# Patient Record
Sex: Female | Born: 2000
Health system: Southern US, Community
[De-identification: ages and names within clinical notes are randomized; demographics above are authoritative.]

## PROBLEM LIST (undated history)

## (undated) HISTORY — PX: WISDOM TOOTH EXTRACTION: SHX21

## (undated) HISTORY — PX: MOUTH SURGERY: SHX715

---
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
## 2019-07-17 DIAGNOSIS — Z1159 Encounter for screening for other viral diseases: Secondary | ICD-10-CM | POA: Diagnosis not present

## 2019-07-17 DIAGNOSIS — Z20828 Contact with and (suspected) exposure to other viral communicable diseases: Secondary | ICD-10-CM | POA: Diagnosis not present

## 2019-11-11 ENCOUNTER — Ambulatory Visit: Payer: BC Managed Care – PPO | Admitting: Obstetrics & Gynecology

## 2019-11-11 ENCOUNTER — Other Ambulatory Visit: Payer: Self-pay

## 2019-11-11 ENCOUNTER — Encounter: Payer: Self-pay | Admitting: Obstetrics & Gynecology

## 2019-11-11 VITALS — BP 114/70 | HR 84 | Ht 68.0 in | Wt 178.0 lb

## 2019-11-11 DIAGNOSIS — N914 Secondary oligomenorrhea: Secondary | ICD-10-CM

## 2019-11-11 DIAGNOSIS — Z113 Encounter for screening for infections with a predominantly sexual mode of transmission: Secondary | ICD-10-CM

## 2019-11-11 DIAGNOSIS — Z30430 Encounter for insertion of intrauterine contraceptive device: Secondary | ICD-10-CM

## 2019-11-11 DIAGNOSIS — Z3202 Encounter for pregnancy test, result negative: Secondary | ICD-10-CM | POA: Diagnosis not present

## 2019-11-11 DIAGNOSIS — Z3009 Encounter for other general counseling and advice on contraception: Secondary | ICD-10-CM

## 2019-11-11 DIAGNOSIS — Z01419 Encounter for gynecological examination (general) (routine) without abnormal findings: Secondary | ICD-10-CM

## 2019-11-11 LAB — POCT URINE PREGNANCY: Preg Test, Ur: NEGATIVE

## 2019-11-11 MED ORDER — LEVONORGESTREL 13.5 MG IU IUD
INTRAUTERINE_SYSTEM | Freq: Once | INTRAUTERINE | Status: AC
Start: 2019-11-11 — End: 2019-11-11
  Administered 2019-11-11: 15:00:00 via INTRAUTERINE

## 2019-11-11 NOTE — Progress Notes (Signed)
   Subjective:    Patient ID: Ana Lucero, female    DOB: 2001-05-27, 18 y.o.   MRN: [id number]  HPI  Pt presents wanting IUD.  Pt is not currently sexually active. She has not had sex in psat 2 weeks.  We reviewed all types of birth control and risks benefits.  She would like the IUD.  Pt does not have regular menses so this would be good way to protect her endometrium.    Review of Systems  Constitutional: Negative.   Respiratory: Negative.   Cardiovascular: Negative.   Genitourinary: Negative.   Psychiatric/Behavioral: Negative.        Objective:   Physical Exam Vitals reviewed.  Constitutional:      General: She is not in acute distress.    Appearance: She is well-developed.  HENT:     Head: Normocephalic and atraumatic.  Eyes:     Conjunctiva/sclera: Conjunctivae normal.  Cardiovascular:     Rate and Rhythm: Normal rate.  Pulmonary:     Effort: Pulmonary effort is normal.  Skin:    General: Skin is warm and dry.  Neurological:     Mental Status: She is alert and oriented to person, place, and time.    Vitals:   11/11/19 1429  BP: 114/70  Pulse: 84  Weight: 178 lb (80.7 kg)  Height: 5\' 8"  (1.727 m)     Assessment & Plan:  18 yo female wanting IUD UPT negative Cultures today Contraception counseling  IUD Procedure Note Patient identified, informed consent performed.  Discussed risks of irregular bleeding, cramping, infection, malpositioning or misplacement of the IUD outside the uterus which may require further procedures. Time out was performed.  Urine pregnancy test negative.  Speculum placed in the vagina.  Cervix visualized.  Cleaned with Betadine x 2.  Grasped anteriorly with a single tooth tenaculum.  Uterus sounded to 7 cm.  Ana Lucero IUD placed per manufacturer's recommendations.  Strings trimmed to 3 cm. Tenaculum was removed, good hemostasis noted.  Patient tolerated procedure well.   Patient was given post-procedure instructions and the [facility] care card  with expiration date.  Patient was also asked to check IUD strings periodically and follow up in 4-6 weeks for IUD check.

## 2019-11-12 DIAGNOSIS — N915 Oligomenorrhea, unspecified: Secondary | ICD-10-CM | POA: Insufficient documentation

## 2019-11-12 LAB — CERVICOVAGINAL ANCILLARY ONLY
Chlamydia: NEGATIVE
Comment: NEGATIVE
Comment: NORMAL
Neisseria Gonorrhea: NEGATIVE

## 2019-12-03 ENCOUNTER — Ambulatory Visit: Payer: BC Managed Care – PPO | Admitting: Certified Nurse Midwife

## 2019-12-06 ENCOUNTER — Ambulatory Visit: Payer: BC Managed Care – PPO | Admitting: Certified Nurse Midwife

## 2019-12-06 DIAGNOSIS — Z09 Encounter for follow-up examination after completed treatment for conditions other than malignant neoplasm: Secondary | ICD-10-CM

## 2020-06-09 ENCOUNTER — Telehealth: Payer: Self-pay

## 2020-06-09 NOTE — Telephone Encounter (Signed)
Dallas's dad called and states she is really down and stating at times she would be better off dead. I advised Chrissie Noa to take her to the ED for evaluation if she is a harm to herself or others. He didn't feel like she would harm herself or others. He states she will be home with the whole family. She has been scheduled for tomorrow to see Dr Linford Arnold.

## 2020-06-10 ENCOUNTER — Ambulatory Visit (INDEPENDENT_AMBULATORY_CARE_PROVIDER_SITE_OTHER): Payer: BC Managed Care – PPO | Admitting: Family Medicine

## 2020-06-10 ENCOUNTER — Encounter: Payer: Self-pay | Admitting: Family Medicine

## 2020-06-10 DIAGNOSIS — F411 Generalized anxiety disorder: Secondary | ICD-10-CM | POA: Diagnosis not present

## 2020-06-10 MED ORDER — SERTRALINE HCL 50 MG PO TABS: ORAL_TABLET | ORAL | 1 refills | Status: DC

## 2020-06-10 NOTE — Progress Notes (Addendum)
Established Patient Office Visit  Subjective:  Patient ID: Ana Lucero, female    DOB: 06-12-2001  Age: 19 y.o. MRN: [id number]  CC:  Chief Complaint  Patient presents with  . mood    HPI Ana Lucero presents to discuss depression.  Initially seen he said she really did not want to be here that her parents had requested that she come.  They were concerned because of her anxiety and depression.  She reports that she remembers feeling anxious from the time she was about 13.  She is currently in college studying criminal justice and also working full-time at [employer] to help be able to afford to go to college.  She feels like if she had more money that she did not have to work and could focus on school the lot of her stress and depressive symptoms would actually improve.  But it is been very difficult to do both.  She is not interested in any therapy or counseling.  Sounds like she started to do some therapy at some point and did not have a good experience.  She is never been on prescription medication for anxiety or depression.  No past medical history on file.  Past Surgical History:  Procedure Laterality Date  . MOUTH SURGERY    . WISDOM TOOTH EXTRACTION      Family History  Problem Relation Age of Onset  . Breast cancer Paternal Grandmother   . Ovarian cancer Paternal Grandmother   . Breast cancer Maternal Grandmother   . Leukemia Maternal Grandfather     Social History   Socioeconomic History  . Marital status: Single    Spouse name: Not on file  . Number of children: Not on file  . Years of education: Not on file  . Highest education level: Not on file  Occupational History  . Not on file  Tobacco Use  . Smoking status: Never Smoker  . Smokeless tobacco: Never Used  Substance and Sexual Activity  . Alcohol use: No  . Drug use: Never  . Sexual activity: Not Currently    Birth control/protection: None  Other Topics Concern  . Not on file  Social History  Narrative  . Not on file   Social Determinants of Health   Financial Resource Strain:   . Difficulty of Paying Living Expenses:   Food Insecurity:   . Worried About Charity fundraiser in the Last Year:   . Arboriculturist in the Last Year:   Transportation Needs:   . Film/video editor (Medical):   Marland Kitchen Lack of Transportation (Non-Medical):   Physical Activity:   . Days of Exercise per Week:   . Minutes of Exercise per Session:   Stress:   . Feeling of Stress :   Social Connections:   . Frequency of Communication with Friends and Family:   . Frequency of Social Gatherings with Friends and Family:   . Attends Religious Services:   . Active Member of Clubs or Organizations:   . Attends Archivist Meetings:   Marland Kitchen Marital Status:   Intimate Partner Violence:   . Fear of Current or Ex-Partner:   . Emotionally Abused:   Marland Kitchen Physically Abused:   . Sexually Abused:     No outpatient medications prior to visit.   No facility-administered medications prior to visit.    No Known Allergies  ROS Review of Systems    Objective:    Physical Exam Constitutional:  Appearance: She is well-developed.  HENT:     Head: Normocephalic and atraumatic.  Cardiovascular:     Rate and Rhythm: Normal rate and regular rhythm.     Heart sounds: Normal heart sounds.  Pulmonary:     Effort: Pulmonary effort is normal.     Breath sounds: Normal breath sounds.  Skin:    General: Skin is warm and dry.  Neurological:     Mental Status: She is alert and oriented to person, place, and time.  Psychiatric:        Behavior: Behavior normal.     BP 110/61   Pulse 78   Ht _0  (1.727 m)   Wt 181 lb (82.1 kg)   SpO2 98%   BMI 27.52 kg/m  Wt Readings from Last 3 Encounters:  06/10/20 181 lb (82.1 kg) (95 %, Z= 1.61)*  11/11/19 178 lb (80.7 kg) (94 %, Z= 1.57)*  09/12/17 153 lb (69.4 kg) (87 %, Z= 1.15)*   * Growth percentiles are based on CDC (Girls, 2-20 Years) data.      Health Maintenance Due  Topic Date Due  . Hepatitis C Screening  Never done  . HIV Screening  Never done    There are no preventive care reminders to display for this patient.  No results found for: TSH No results found for: WBC, HGB, HCT, MCV, PLT No results found for: NA, K, CHLORIDE, CO2, GLUCOSE, BUN, CREATININE, BILITOT, ALKPHOS, AST, ALT, PROT, ALBUMIN, CALCIUM, ANIONGAP, EGFR, GFR No results found for: CHOL No results found for: HDL No results found for: LDLCALC No results found for: TRIG No results found for: CHOLHDL No results found for: HGBA1C    Assessment & Plan:   Problem List Items Addressed This Visit      Other   GAD (generalized anxiety disorder)    We discussed diagnosis today of generalized anxiety disorder. PHQ 9 score of 8 and GAD-7 score of 11.  She does have some depression but I do feel like it is a little bit more situational where as I feel that the anxiety disorder is more ongoing and long-term.  We discussed the nature of anxiety and typical treatment strategies which usually include therapy/counseling to work on behavioral therapy in addition to regular exercise which she does.  She says she goes to the gym about 4 days/week.  In addition to potential using a medication to help reduce the severity of her symptoms.  She really was not quite sure what she wanted to do for treatment but was adamant about not not doing therapy or counseling.  She was agreeable to at least a trial of medication so we will start with sertraline did warn about potential side effects.  Plan to follow-up in 3 to 4 weeks.      Relevant Medications   sertraline (ZOLOFT) 50 MG tablet      Meds ordered this encounter  Medications  . sertraline (ZOLOFT) 50 MG tablet    Sig: 1/2 tab po QD x 8 days then whole tab daily    Dispense:  30 tablet    Refill:  1    Follow-up: Return in about 26 days (around 07/06/2020) for New start medication.   Time spent 25 minutes in  encounter.  Beatrice Lecher, MD

## 2020-06-10 NOTE — Telephone Encounter (Signed)
Agree with documentation as above.   Makiyah Zentz, MD  

## 2020-06-10 NOTE — Assessment & Plan Note (Addendum)
We discussed diagnosis today of generalized anxiety disorder. PHQ 9 score of 8 and GAD-7 score of 11.  She does have some depression but I do feel like it is a little bit more situational where as I feel that the anxiety disorder is more ongoing and long-term.  We discussed the nature of anxiety and typical treatment strategies which usually include therapy/counseling to work on behavioral therapy in addition to regular exercise which she does.  She says she goes to the gym about 4 days/week.  In addition to potential using a medication to help reduce the severity of her symptoms.  She really was not quite sure what she wanted to do for treatment but was adamant about not not doing therapy or counseling.  She was agreeable to at least a trial of medication so we will start with sertraline did warn about potential side effects.  Plan to follow-up in 3 to 4 weeks.

## 2020-07-06 ENCOUNTER — Ambulatory Visit (INDEPENDENT_AMBULATORY_CARE_PROVIDER_SITE_OTHER): Payer: BC Managed Care – PPO | Admitting: Family Medicine

## 2020-07-06 ENCOUNTER — Other Ambulatory Visit: Payer: Self-pay

## 2020-07-06 ENCOUNTER — Encounter: Payer: Self-pay | Admitting: Family Medicine

## 2020-07-06 DIAGNOSIS — F411 Generalized anxiety disorder: Secondary | ICD-10-CM

## 2020-07-06 MED ORDER — SERTRALINE HCL 50 MG PO TABS: 50.0000 mg | ORAL_TABLET | Freq: Every day | ORAL | 1 refills | Status: DC

## 2020-07-06 NOTE — Progress Notes (Signed)
Doing well on current regimen. She does notice that she will get a headache if she doesn't eat or takes the medication too early in the morning. She still gets a little anxious about things.

## 2020-07-06 NOTE — Progress Notes (Signed)
Established Patient Office Visit  Subjective:  Patient ID: Ana Lucero, female    DOB: 08-08-2001  Age: 19 y.o. MRN: [id number]  CC:  Chief Complaint  Patient presents with  . mood    HPI Ana Lucero presents for follow-up generalized anxiety disorder.  So far she is actually been tolerating the medication pretty well she says that she will get a little bit of nausea and sometimes a mild headache for about an hour after she takes it but she does take it around 4 AM and then does not eat breakfast around 10 AM she says to get up really early for her job.  She says if she moves it to nighttime she just would be over take it very consistently.  She has noticed that she has not been worrying as much since starting the medication she has been on it for about 3 weeks at this point.  No other negative side effects she feels like sleep is okay.  She is finished up summer school classes and to has a break for about a week and then she will start back in the fall but she will be on campus.  History reviewed. No pertinent past medical history.  Past Surgical History:  Procedure Laterality Date  . MOUTH SURGERY    . WISDOM TOOTH EXTRACTION      Family History  Problem Relation Age of Onset  . Breast cancer Paternal Grandmother   . Ovarian cancer Paternal Grandmother   . Breast cancer Maternal Grandmother   . Leukemia Maternal Grandfather     Social History   Socioeconomic History  . Marital status: Single    Spouse name: Not on file  . Number of children: Not on file  . Years of education: Not on file  . Highest education level: Not on file  Occupational History  . Not on file  Tobacco Use  . Smoking status: Never Smoker  . Smokeless tobacco: Never Used  Substance and Sexual Activity  . Alcohol use: No  . Drug use: Never  . Sexual activity: Not Currently    Birth control/protection: None  Other Topics Concern  . Not on file  Social History Narrative  . Not on file   Social  Determinants of Health   Financial Resource Strain:   . Difficulty of Paying Living Expenses:   Food Insecurity:   . Worried About Charity fundraiser in the Last Year:   . Arboriculturist in the Last Year:   Transportation Needs:   . Film/video editor (Medical):   Marland Kitchen Lack of Transportation (Non-Medical):   Physical Activity:   . Days of Exercise per Week:   . Minutes of Exercise per Session:   Stress:   . Feeling of Stress :   Social Connections:   . Frequency of Communication with Friends and Family:   . Frequency of Social Gatherings with Friends and Family:   . Attends Religious Services:   . Active Member of Clubs or Organizations:   . Attends Archivist Meetings:   Marland Kitchen Marital Status:   Intimate Partner Violence:   . Fear of Current or Ex-Partner:   . Emotionally Abused:   Marland Kitchen Physically Abused:   . Sexually Abused:     Outpatient Medications Prior to Visit  Medication Sig Dispense Refill  . sertraline (ZOLOFT) 50 MG tablet 1/2 tab po QD x 8 days then whole tab daily 30 tablet 1   No facility-administered medications prior  to visit.    No Known Allergies  ROS Review of Systems    Objective:    Physical Exam Constitutional:      Appearance: She is well-developed.  HENT:     Head: Normocephalic and atraumatic.  Cardiovascular:     Rate and Rhythm: Normal rate and regular rhythm.     Heart sounds: Normal heart sounds.  Pulmonary:     Effort: Pulmonary effort is normal.     Breath sounds: Normal breath sounds.  Skin:    General: Skin is warm and dry.  Neurological:     Mental Status: She is alert and oriented to person, place, and time.  Psychiatric:        Behavior: Behavior normal.     BP (!) 110/57   Pulse 77   Ht _0  (1.727 m)   Wt 185 lb (83.9 kg)   SpO2 100%   BMI 28.13 kg/m  Wt Readings from Last 3 Encounters:  07/06/20 185 lb (83.9 kg)  06/10/20 181 lb (82.1 kg) (95 %, Z= 1.61)*  11/11/19 178 lb (80.7 kg) (94 %, Z= 1.57)*    * Growth percentiles are based on CDC (Girls, 2-20 Years) data.     Health Maintenance Due  Topic Date Due  . Hepatitis C Screening  Never done  . HIV Screening  Never done  . INFLUENZA VACCINE  06/28/2020    There are no preventive care reminders to display for this patient.  No results found for: TSH No results found for: WBC, HGB, HCT, MCV, PLT No results found for: NA, K, CHLORIDE, CO2, GLUCOSE, BUN, CREATININE, BILITOT, ALKPHOS, AST, ALT, PROT, ALBUMIN, CALCIUM, ANIONGAP, EGFR, GFR No results found for: CHOL No results found for: HDL No results found for: LDLCALC No results found for: TRIG No results found for: CHOLHDL No results found for: HGBA1C    Assessment & Plan:   Problem List Items Addressed This Visit      Other   GAD (generalized anxiety disorder)    We will continue with current regimen.  PHQ-9 score was 8 now down to 4.  GAD-7 score of 11 is now down to 5 with significant improvement in symptom control.  I think she will continue to improve at the 50 mg dose work and keep her on that for at least another month and then do a virtual visit for follow-up we can make any adjustments as needed at that time.  Encouraged her to speak with the counselors at school to see if she might be able to benefit from some additional financial aid which would help alleviate how much time she has to work in addition to try to do well in school.      Relevant Medications   sertraline (ZOLOFT) 50 MG tablet      Meds ordered this encounter  Medications  . sertraline (ZOLOFT) 50 MG tablet    Sig: Take 1 tablet (50 mg total) by mouth daily.    Dispense:  90 tablet    Refill:  1    Follow-up: Return in about 4 weeks (around 08/03/2020) for virtual visit for Mood Medication .    Beatrice Lecher, MD

## 2020-07-06 NOTE — Assessment & Plan Note (Signed)
We will continue with current regimen.  PHQ-9 score was 8 now down to 4.  GAD-7 score of 11 is now down to 5 with significant improvement in symptom control.  I think she will continue to improve at the 50 mg dose work and keep her on that for at least another month and then do a virtual visit for follow-up we can make any adjustments as needed at that time.  Encouraged her to speak with the counselors at school to see if she might be able to benefit from some additional financial aid which would help alleviate how much time she has to work in addition to try to do well in school.

## 2020-08-07 ENCOUNTER — Telehealth (INDEPENDENT_AMBULATORY_CARE_PROVIDER_SITE_OTHER): Payer: BC Managed Care – PPO | Admitting: Family Medicine

## 2020-08-07 ENCOUNTER — Encounter: Payer: Self-pay | Admitting: Family Medicine

## 2020-08-07 DIAGNOSIS — F411 Generalized anxiety disorder: Secondary | ICD-10-CM | POA: Diagnosis not present

## 2020-08-07 NOTE — Progress Notes (Signed)
Previous PHQ=4/SWD; GAD=5/SWD.  She is doing well on current regimen.

## 2020-08-07 NOTE — Assessment & Plan Note (Signed)
Is actually happy with her current regimen.  She does get some frequent loose stools on the sertraline but she is happy with staying with the 50 mg and says its not a deal breaker.  Continue current regimen and if she is otherwise doing well I will see her back in about 3 months.

## 2020-08-07 NOTE — Progress Notes (Signed)
Virtual Visit via Video Note  I connected with Ana Lucero on 08/07/20 at  3:40 PM EDT by a video enabled telemedicine application and verified that I am speaking with the correct person using two identifiers.   I discussed the limitations of evaluation and management by telemedicine and the availability of in person appointments. The patient expressed understanding and agreed to proceed.  Patient location: at school  Provider location: in office  Subjective:    CC: Anxiety/medication  HPI: Follow-up anxiety-she just started fall classes after finishing up summer school.  She is currently on sertraline 50 mg daily.  She is actually doing really well she initially had headaches when she first started it but says that has resolved.  She has been getting some daily frequent loose stools but that is not bothersome enough that she wants to stop the medication.  She has not been doing any therapy or counseling but did reach out to her advisor at her school who has been able to give helpful tips.  She has found it useful.  Quality is fair.  She feels like she has a good support network friends at home and her family.  She does not really have any close friends up at school though.   Past medical history, Surgical history, Family history not pertinant except as noted below, Social history, Allergies, and medications have been entered into the medical record, reviewed, and corrections made.   Review of Systems: No fevers, chills, night sweats, weight loss, chest pain, or shortness of breath.   Objective:    General: Speaking clearly in complete sentences without any shortness of breath.  Alert and oriented x3.  Normal judgment. No apparent acute distress.    Impression and Recommendations:    GAD (generalized anxiety disorder) Is actually happy with her current regimen.  She does get some frequent loose stools on the sertraline but she is happy with staying with the 50 mg and says its not a deal  breaker.  Continue current regimen and if she is otherwise doing well I will see her back in about 3 months.    Time spent in encounter 20 minutes  I discussed the assessment and treatment plan with the patient. The patient was provided an opportunity to ask questions and all were answered. The patient agreed with the plan and demonstrated an understanding of the instructions.   The patient was advised to call back or seek an in-person evaluation if the symptoms worsen or if the condition fails to improve as anticipated.   Nani Gasser, MD

## 2020-12-12 ENCOUNTER — Other Ambulatory Visit: Payer: Self-pay | Admitting: Family Medicine

## 2020-12-12 DIAGNOSIS — F411 Generalized anxiety disorder: Secondary | ICD-10-CM

## 2021-03-07 DIAGNOSIS — J40 Bronchitis, not specified as acute or chronic: Secondary | ICD-10-CM | POA: Diagnosis not present

## 2021-03-07 DIAGNOSIS — R059 Cough, unspecified: Secondary | ICD-10-CM | POA: Diagnosis not present

## 2021-03-07 DIAGNOSIS — R0981 Nasal congestion: Secondary | ICD-10-CM | POA: Diagnosis not present

## 2021-03-07 DIAGNOSIS — H1031 Unspecified acute conjunctivitis, right eye: Secondary | ICD-10-CM | POA: Diagnosis not present

## 2021-03-10 ENCOUNTER — Ambulatory Visit (INDEPENDENT_AMBULATORY_CARE_PROVIDER_SITE_OTHER): Payer: BC Managed Care – PPO

## 2021-03-10 ENCOUNTER — Ambulatory Visit: Payer: BC Managed Care – PPO | Admitting: Sports Medicine

## 2021-03-10 ENCOUNTER — Other Ambulatory Visit: Payer: Self-pay

## 2021-03-10 DIAGNOSIS — M79661 Pain in right lower leg: Secondary | ICD-10-CM

## 2021-03-10 DIAGNOSIS — M79604 Pain in right leg: Secondary | ICD-10-CM

## 2021-03-10 MED ORDER — CALCIUM CARBONATE-VITAMIN D 600-400 MG-UNIT PO TABS
1.0000 | ORAL_TABLET | Freq: Two times a day (BID) | ORAL | 11 refills | Status: DC
Start: 2021-03-10 — End: 2021-04-06

## 2021-03-10 MED ORDER — MELOXICAM 15 MG PO TABS
ORAL_TABLET | ORAL | 3 refills | Status: DC
Start: 2021-03-10 — End: 2021-04-06

## 2021-03-10 NOTE — Progress Notes (Signed)
    Procedures performed today:    None.  Independent interpretation of notes and tests performed by another provider:   None.  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 Clinically she has some fullness and tenderness in this area. She really does not endorse any changes in her training regimen, she runs about 6 to 7 miles a week, trains every day in the gym, and plays lacrosse. My suspicion is this was a stress fracture, she does tell me its improving slightly. We will do a stirrup Aircast, calcium and vitamin D supplementation, meloxicam, x-rays. Holding off on any relative rest in the gym for now however I have advised her not to run for the next month. Return to see me in 4 weeks, MRI if no better.    ___________________________________________ Ihor Austin. Benjamin Stain, M.D., ABFM., CAQSM. Primary Care and Sports Medicine [place] [facility]  Adjunct Instructor of Family Medicine  [employer]

## 2021-03-10 NOTE — Assessment & Plan Note (Addendum)
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 Clinically she has some fullness and tenderness in this area. She really does not endorse any changes in her training regimen, she runs about 6 to 7 miles a week, trains every day in the gym, and plays lacrosse. My suspicion is this was a stress fracture, she does tell me its improving slightly. We will do a stirrup Aircast, calcium and vitamin D supplementation, meloxicam, x-rays. Holding off on any relative rest in the gym for now however I have advised her not to run for the next month. Return to see me in 4 weeks, MRI if no better.

## 2021-04-06 ENCOUNTER — Other Ambulatory Visit: Payer: Self-pay

## 2021-04-06 ENCOUNTER — Ambulatory Visit (INDEPENDENT_AMBULATORY_CARE_PROVIDER_SITE_OTHER): Payer: BC Managed Care – PPO | Admitting: Sports Medicine

## 2021-04-06 DIAGNOSIS — M79604 Pain in right leg: Secondary | ICD-10-CM | POA: Diagnosis not present

## 2021-04-06 NOTE — Progress Notes (Signed)
    Procedures performed today:    None.  Independent interpretation of notes and tests performed by another provider:   None.  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 Clinically there was some fullness and tenderness in this area. She had not had any changes in her training regimen, runs 6 to 7 miles per week, and trains every day in the gym. I told her to decrease her physical activity, place her in a Aircast, with the suspicion that this was a stress fracture. She was also given calcium and vitamin D supplementation, meloxicam. X-rays at the time were negative. Today she is now a month later, approximately 80% better. Still has some discomfort but heading in the right direction. Her Aircast does seem defective in particular falling apart so we will exchange it for a new one. I'd like to see her back in about 6 weeks to reevaluate before considering MRI. No change in the treatment plan for now.    ___________________________________________ Ihor Austin. Benjamin Stain, M.D., ABFM., CAQSM. Primary Care and Sports Medicine [place] [facility]  Adjunct Instructor of Family Medicine  [employer]

## 2021-04-06 NOTE — Assessment & Plan Note (Signed)
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 Clinically there was some fullness and tenderness in this area. She had not had any changes in her training regimen, runs 6 to 7 miles per week, and trains every day in the gym. I told her to decrease her physical activity, place her in a Aircast, with the suspicion that this was a stress fracture. She was also given calcium and vitamin D supplementation, meloxicam. X-rays at the time were negative. Today she is now a month later, approximately 80% better. Still has some discomfort but heading in the right direction. Her Aircast does seem defective in particular falling apart so we will exchange it for a new one. I'd like to see her back in about 6 weeks to reevaluate before considering MRI. No change in the treatment plan for now.

## 2021-05-13 ENCOUNTER — Telehealth: Payer: Self-pay | Admitting: Family Medicine

## 2021-05-13 NOTE — Telephone Encounter (Signed)
FYI: Pt called at 10:00 to reschedule her 3:45 appt for today. Appt was rescheduled.

## 2021-05-18 ENCOUNTER — Ambulatory Visit: Payer: BC Managed Care – PPO | Admitting: Sports Medicine

## 2021-06-01 ENCOUNTER — Ambulatory Visit (INDEPENDENT_AMBULATORY_CARE_PROVIDER_SITE_OTHER): Payer: BC Managed Care – PPO | Admitting: Nurse Practitioner

## 2021-06-01 ENCOUNTER — Other Ambulatory Visit: Payer: Self-pay

## 2021-06-01 ENCOUNTER — Encounter: Payer: Self-pay | Admitting: Nurse Practitioner

## 2021-06-01 VITALS — BP 116/63 | HR 82 | Resp 16 | Ht 68.0 in | Wt 178.0 lb

## 2021-06-01 DIAGNOSIS — Z975 Presence of (intrauterine) contraceptive device: Secondary | ICD-10-CM

## 2021-06-01 DIAGNOSIS — R102 Pelvic and perineal pain: Secondary | ICD-10-CM | POA: Diagnosis not present

## 2021-06-01 DIAGNOSIS — Z30431 Encounter for routine checking of intrauterine contraceptive device: Secondary | ICD-10-CM

## 2021-06-01 MED ORDER — LEVONORGESTREL 13.5 MG IU IUD: INTRAUTERINE_SYSTEM | Freq: Once | INTRAUTERINE | Status: DC

## 2021-06-01 NOTE — Progress Notes (Signed)
GYNECOLOGY OFFICE VISIT NOTE   History:  20 y.o. G0P0000 here today for IUD concerns.  She could not feel the strings at home and wonders if the IUD has twisted.  About one year after having the IUD, she began having periodic pain.  It does not come at particular times, just now and then.  This pain did not happen before.  She is not sexually active.  She denies any abnormal vaginal discharge, bleeding, pelvic pain or other concerns.  She is planning to enroll in the [place] soon and fears she will not be able to return for IUD replacement when it is due next year.  She also wonders if she could get an IUD that lasts longer than 3 years.  History reviewed. No pertinent past medical history.  Past Surgical History:  Procedure Laterality Date   MOUTH SURGERY     WISDOM TOOTH EXTRACTION      The following portions of the patient's history were reviewed and updated as appropriate: allergies, current medications, past family history, past medical history, past social history, past surgical history and problem list.    Review of Systems:  Pertinent items noted in HPI and remainder of comprehensive ROS otherwise negative.  Objective:  Physical Exam BP 116/63   Pulse 82   Resp 16   Ht 5\' 8"  (1.727 m)   Wt 178 lb (80.7 kg)   BMI 27.06 kg/m  CONSTITUTIONAL: Well-developed, well-nourished female in no acute distress.  HENT:  Normocephalic, atraumatic. External right and left ear normal.  EYES: Conjunctivae and EOM are normal. Pupils are equal, round.  No scleral icterus.  NECK: Normal range of motion, supple, no masses SKIN: Skin is warm and dry. No rash noted. Not diaphoretic. No erythema. No pallor. NEUROLOGIC: Alert and oriented to person, place, and time. Normal muscle tone coordination. No cranial nerve deficit noted. PSYCHIATRIC: Normal mood and affect. Normal behavior. Normal judgment and thought content. CARDIOVASCULAR: Normal heart rate noted RESPIRATORY: Effort and breath sounds  normal, no problems with respiration noted ABDOMEN: Soft, no distention noted.   PELVIC: Normal appearing external genitalia; normal appearing vaginal mucosa and cervix.  No abnormal discharge noted.  Normal uterine size, no other palpable masses, no uterine or adnexal tenderness. IUD strings visualized 1.5 cm at os MUSCULOSKELETAL: Normal range of motion. No edema noted.  Labs and Imaging No results found.  Assessment & Plan:  1. IUD check up Unable to feel IUD strings but strings visualized about 1.5 cm today. Client is very physically active - had a hairline bone fracture in her leg from doing crossfit.  Is planning to Join the in August so thinking she wants the IUD replaced as she may not be able to come in again next year.  2. Pelvic pain Since pain began after having the IUD for one year, will get September to determine location Takes ibuprofen for her leg pain, not necessarily for the pain she has with her IUD  - US PELVIC COMPLETE WITH TRANSVAGINAL; Future   Routine preventative health maintenance measures emphasized. Please refer to After Visit Summary for other counseling recommendations.   Return in about 3 weeks (around 06/22/2021) for Results of 06/24/2021 and check on IUD.   Total face-to-face time with patient:  10  minutes.  Over 50% of encounter was spent on counseling and coordination of care.  [place], RN, MSN, NP-BC Nurse Practitioner, [facility] for [facility] 06/01/2021 9:02 AM

## 2021-06-08 ENCOUNTER — Other Ambulatory Visit: Payer: BC Managed Care – PPO

## 2021-06-14 ENCOUNTER — Other Ambulatory Visit: Payer: Self-pay

## 2021-06-14 ENCOUNTER — Ambulatory Visit (INDEPENDENT_AMBULATORY_CARE_PROVIDER_SITE_OTHER): Payer: BC Managed Care – PPO | Admitting: Obstetrics & Gynecology

## 2021-06-14 ENCOUNTER — Encounter: Payer: Self-pay | Admitting: Obstetrics & Gynecology

## 2021-06-14 VITALS — BP 113/63 | HR 82 | Ht 68.0 in | Wt 176.0 lb

## 2021-06-14 DIAGNOSIS — Z30430 Encounter for insertion of intrauterine contraceptive device: Secondary | ICD-10-CM

## 2021-06-14 DIAGNOSIS — T839XXS Unspecified complication of genitourinary prosthetic device, implant and graft, sequela: Secondary | ICD-10-CM

## 2021-06-14 DIAGNOSIS — R102 Pelvic and perineal pain: Secondary | ICD-10-CM

## 2021-06-14 DIAGNOSIS — Z01812 Encounter for preprocedural laboratory examination: Secondary | ICD-10-CM | POA: Diagnosis not present

## 2021-06-14 DIAGNOSIS — Z3202 Encounter for pregnancy test, result negative: Secondary | ICD-10-CM | POA: Diagnosis not present

## 2021-06-14 LAB — POCT URINE PREGNANCY: Preg Test, Ur: NEGATIVE

## 2021-06-14 MED ORDER — LEVONORGESTREL 19.5 MG IU IUD: INTRAUTERINE_SYSTEM | Freq: Once | INTRAUTERINE | Status: AC

## 2021-06-14 NOTE — Progress Notes (Signed)
   Subjective:    Patient ID: Ana Lucero, female    DOB: Feb 15, 2001, 19 y.o.   MRN: [id number]  HPI  Pt presents for f/u of bleeding and pain from IUD.  She is also going into the [employer] (making decision soon).  She would like to replace with a longer acting IUD--Kyleena is her preference.  Pt did not get the [place] yet.  She would prefer to try a switch of IUD before the [place].    Review of Systems  Constitutional: Negative.   Respiratory: Negative.    Cardiovascular: Negative.   Gastrointestinal: Negative.   Genitourinary:  Positive for menstrual problem.      Objective:   Physical Exam Vitals reviewed.  Constitutional:      General: She is not in acute distress.    Appearance: She is well-developed.  HENT:     Head: Normocephalic and atraumatic.  Eyes:     Conjunctiva/sclera: Conjunctivae normal.  Cardiovascular:     Rate and Rhythm: Normal rate.  Pulmonary:     Effort: Pulmonary effort is normal.  Genitourinary:    Comments: IUD strings 4-5 cm No vaginal or cervical lesions Small amount of blood in the vault at time of speculum exam.  Skin:    General: Skin is warm and dry.  Neurological:     Mental Status: She is alert and oriented to person, place, and time.  Psychiatric:        Mood and Affect: Mood normal.  Vitals:   06/14/21 1026  BP: 113/63  Pulse: 82  Weight: 176 lb (79.8 kg)  Height: 5\' 8"  (1.727 m)      Assessment & Plan:  20 yo female with new onset bleeding and pain with bleeding requesting IUD change.   GYNECOLOGY CLINIC PROCEDURE NOTE  IUD Removal and Reinsertion  Patient identified, informed consent performed. Discussed risks of irregular bleeding, cramping, infection, malpositioning or misplacement of the IUD outside the uterus which may require further procedures. Time out was performed. Speculum placed in the vagina. Cervix visualized. Cleaned with Betadine x 2. Grasped anteriorly with a single tooth tenaculum. The strings of the IUD were  grasped and pulled using ring forceps. The IUD was successfully removed in its entirety. Uterus sounded to 7.5 cm. Kyleena IUD placed per manufacturer's recommendations. Strings trimmed to approx 4 cm. Tenaculum was removed, good hemostasis noted. Patient tolerated procedure well. Patient was given post-procedure instructions.  Patient was also asked to check IUD strings periodically and follow up in 4-6 weeks for IUD check. If pelvic pain and bleeding is still present, we should proceed with 26.

## 2021-07-14 ENCOUNTER — Ambulatory Visit: Payer: BC Managed Care – PPO | Admitting: Obstetrics & Gynecology

## 2021-07-19 ENCOUNTER — Telehealth: Payer: Self-pay

## 2021-07-19 NOTE — Telephone Encounter (Signed)
Pt informed of letter.  Tiajuana Amass, CMA

## 2021-07-19 NOTE — Telephone Encounter (Signed)
Okay for letter saying no longer an active problem.

## 2021-07-19 NOTE — Telephone Encounter (Signed)
Pt stopped by to get a letter to say that she is not being treated for her anxiety any longer. She is going into the [employer], and need this letter today. I told her that this letter could take 5-7 business days. She asked that we give her a call once the letter is completed. [phone]) Tvt

## 2021-10-20 IMAGING — DX DG TIBIA/FIBULA 2V*R*
2 series · 2 of 2 positions shown · non-contrast
Comparison: None.

CLINICAL DATA: Concern for tibial stress fracture. Distal lower leg
pain superior to ankle for 8 weeks after injury during lacrosse.

EXAM:
RIGHT TIBIA AND FIBULA - 2 VIEW

[tibia ap]
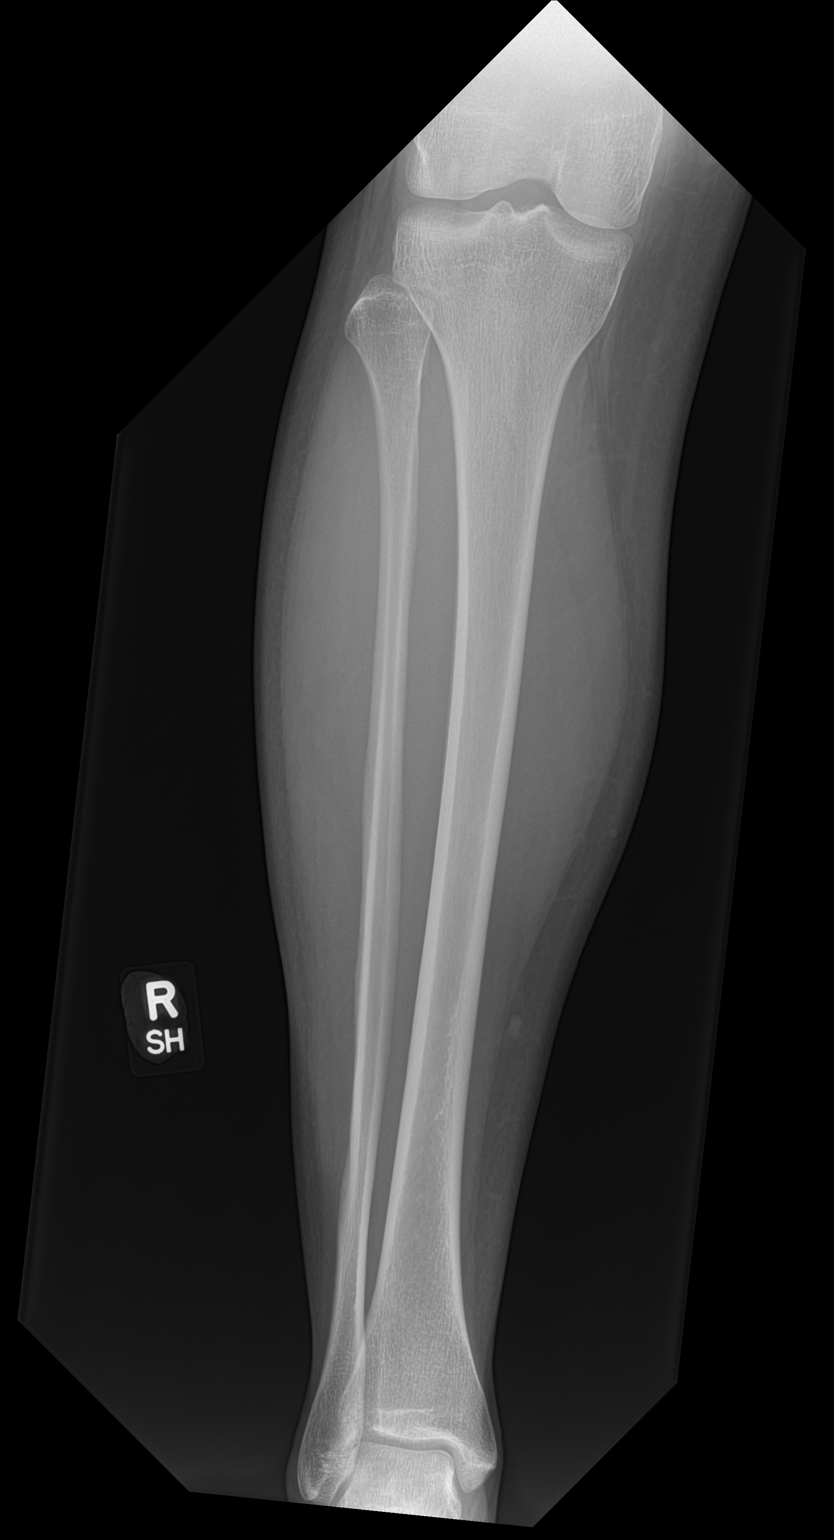

[tibia lat]
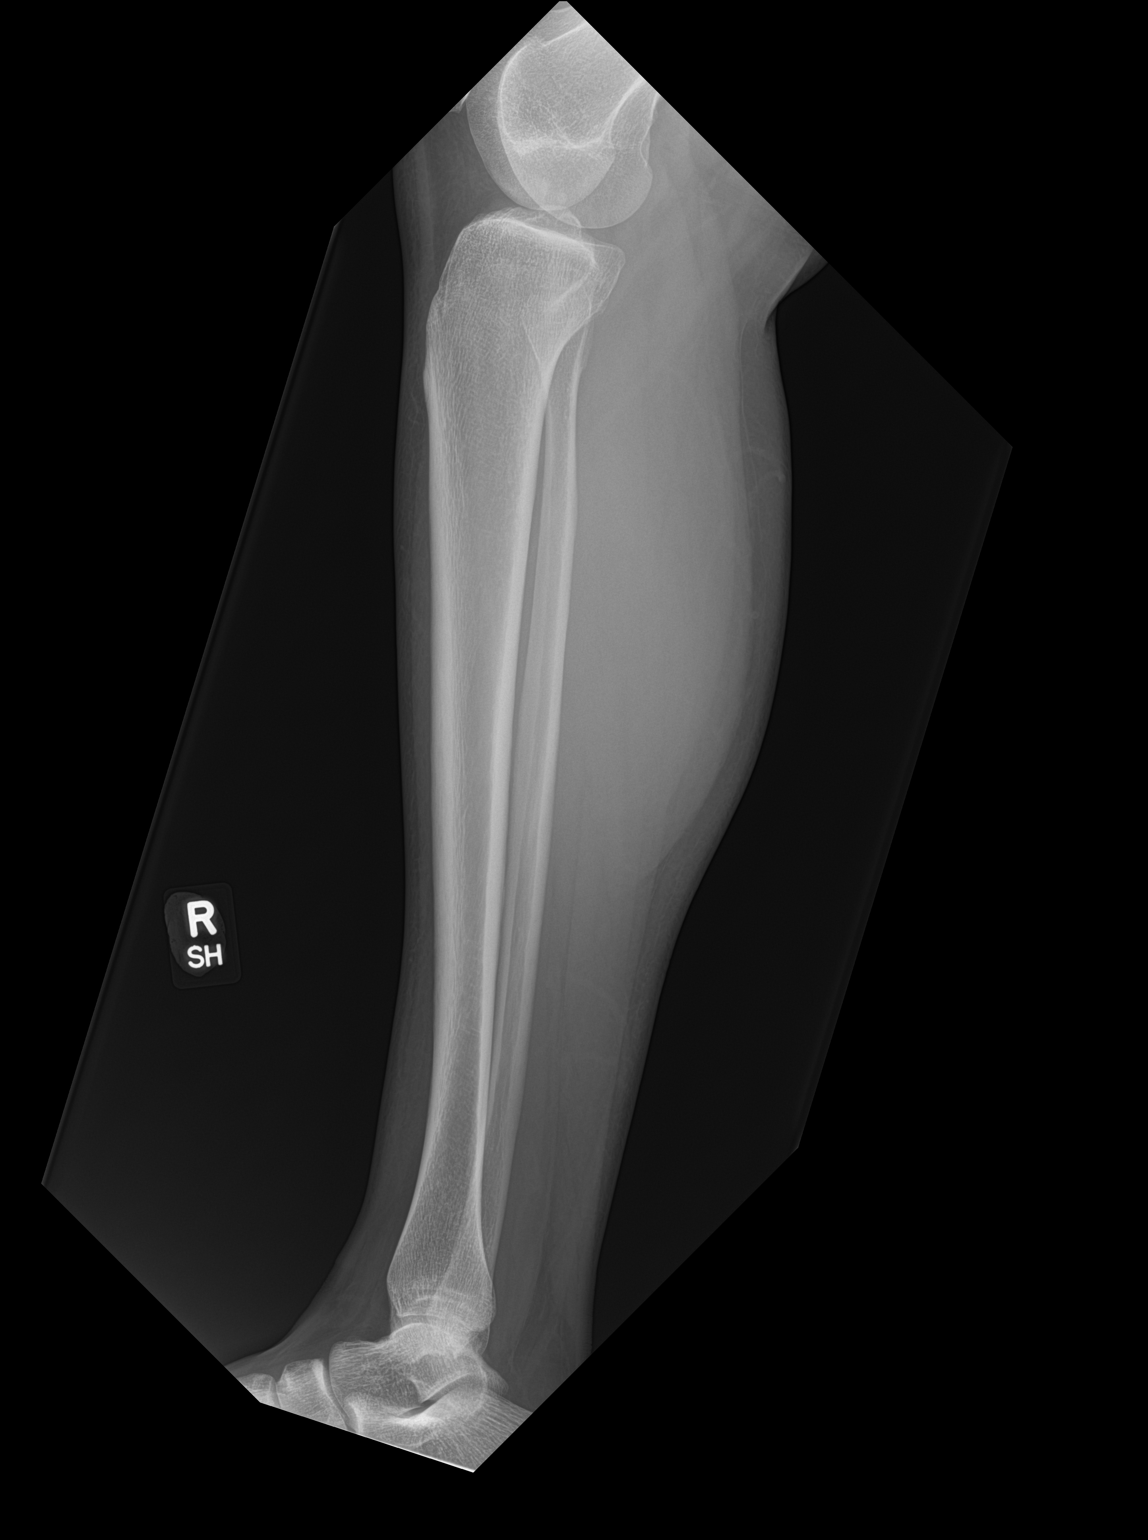

[2 of 2 positions shown; findings below may reference images not displayed]

FINDINGS: No acute bony abnormality. Specifically, no fracture, subluxation,
or dislocation. Specifically, no radiographic evidence of a tibial
stress fracture. Soft tissues are unremarkable.
IMPRESSION: Negative.

## 2021-12-21 ENCOUNTER — Encounter: Payer: Self-pay | Admitting: Family Medicine

## 2021-12-21 ENCOUNTER — Other Ambulatory Visit: Payer: Self-pay

## 2021-12-21 ENCOUNTER — Ambulatory Visit: Payer: BC Managed Care – PPO | Admitting: Family Medicine

## 2021-12-21 VITALS — BP 134/72 | HR 88 | Resp 16 | Ht 68.0 in | Wt 186.0 lb

## 2021-12-21 DIAGNOSIS — K5900 Constipation, unspecified: Secondary | ICD-10-CM

## 2021-12-21 DIAGNOSIS — R140 Abdominal distension (gaseous): Secondary | ICD-10-CM

## 2021-12-21 DIAGNOSIS — K2100 Gastro-esophageal reflux disease with esophagitis, without bleeding: Secondary | ICD-10-CM | POA: Diagnosis not present

## 2021-12-21 NOTE — Progress Notes (Signed)
Established Patient Office Visit  Subjective:  Patient ID: Ana Lucero, female    DOB: June 11, 2001  Age: 21 y.o. MRN: [id number]  CC:  Chief Complaint  Patient presents with   Constipation    Patient states she has a BM every 3-4 days. Patient would like to discuss Gluten intolerance.    HPI Ana Lucero presents for Patient states she has a BM every 3-4 days.  She said she has been like that for years sometimes she can have to strain but sometimes not.  She even tried taking a fiber supplement 4 weeks and really did not get significant improvement.  She tried taking MiraLAX daily for 3 weeks and also really did not notice a big improvement.  She says she always feels bloated to the point that her stomach often times feels very sensitive and tender to touch.  And just feels hard.  She sometimes will get reflux but its not daily she said maybe once a week.  And often times if she eats foods that she knows might trigger it.  It seems to always be more bothersome in the evening.  She does not experience any significant nausea no vomiting.  No blood in the urine or stool.  She did note that last Monday after eating a hamburger that she got a red itchy rash on her forehead it did improve but now she is left with a few little bumps on the skin.  She is never had that happen before she is not had any other reactions to foods.  She does eat beef regularly and has never had a problem with that before.  She says occasionally she will get a red itchy ear as well but is not sure if that is related.  Patient would like to be tested for gluten intolerance.  No past medical history on file.  Past Surgical History:  Procedure Laterality Date   MOUTH SURGERY     WISDOM TOOTH EXTRACTION      Family History  Problem Relation Age of Onset   Breast cancer Paternal Grandmother    Ovarian cancer Paternal Grandmother    Breast cancer Maternal Grandmother    Leukemia Maternal Grandfather     Social History    Socioeconomic History   Marital status: Single    Spouse name: Not on file   Number of children: Not on file   Years of education: Not on file   Highest education level: Not on file  Occupational History   Not on file  Tobacco Use   Smoking status: Never   Smokeless tobacco: Never  Vaping Use   Vaping Use: Never used  Substance and Sexual Activity   Alcohol use: No   Drug use: Never   Sexual activity: Not Currently    Birth control/protection: I.U.D.  Other Topics Concern   Not on file  Social History Narrative   Not on file   Social Determinants of Health   Financial Resource Strain: Not on file  Food Insecurity: Not on file  Transportation Needs: Not on file  Physical Activity: Not on file  Stress: Not on file  Social Connections: Not on file  Intimate Partner Violence: Not on file    Outpatient Medications Prior to Visit  Medication Sig Dispense Refill   Levonorgestrel (SKYLA IU) by Intrauterine route.     No facility-administered medications prior to visit.    No Known Allergies  ROS Review of Systems    Objective:    Physical  Exam Constitutional:      Appearance: Normal appearance. She is well-developed.  HENT:     Head: Normocephalic and atraumatic.  Cardiovascular:     Rate and Rhythm: Normal rate and regular rhythm.     Heart sounds: Normal heart sounds.  Pulmonary:     Effort: Pulmonary effort is normal.     Breath sounds: Normal breath sounds.  Abdominal:     General: Abdomen is flat. Bowel sounds are normal.     Palpations: Abdomen is soft.     Tenderness: There is abdominal tenderness.     Comments: + LLQ and suprapubic area and RLQ  Skin:    General: Skin is warm and dry.  Neurological:     Mental Status: She is alert and oriented to person, place, and time.  Psychiatric:        Behavior: Behavior normal.    BP 134/72    Pulse 88    Resp 16    Ht '5\' 8"'  (1.727 m)    Wt 186 lb (84.4 kg)    SpO2 96%    BMI 28.28 kg/m  Wt  Readings from Last 3 Encounters:  12/21/21 186 lb (84.4 kg)  06/14/21 176 lb (79.8 kg)  06/01/21 178 lb (80.7 kg)     Health Maintenance Due  Topic Date Due   HPV VACCINES (2 - 2-dose series) 11/18/2014   HIV Screening  Never done   Hepatitis C Screening  Never done   PAP-Cervical Cytology Screening  Never done   PAP SMEAR-Modifier  Never done       Topic Date Due   HPV VACCINES (2 - 2-dose series) 11/18/2014    No results found for: TSH No results found for: WBC, HGB, HCT, MCV, PLT No results found for: NA, K, CHLORIDE, CO2, GLUCOSE, BUN, CREATININE, BILITOT, ALKPHOS, AST, ALT, PROT, ALBUMIN, CALCIUM, ANIONGAP, EGFR, GFR No results found for: CHOL No results found for: HDL No results found for: LDLCALC No results found for: TRIG No results found for: CHOLHDL No results found for: HGBA1C    Assessment & Plan:   Problem List Items Addressed This Visit       Digestive   Gastroesophageal reflux disease with esophagitis without hemorrhage   Relevant Orders   Tissue Transglutaminase Abs,IgG,IgA     Other   Constipation   Relevant Orders   Tissue Transglutaminase Abs,IgG,IgA   Bloating - Primary   Relevant Orders   Tissue Transglutaminase Abs,IgG,IgA    Discussed options.  I think would be reasonable to test for gluten enteropathy.  If negative she could still consider a trial of going gluten-free for a month to see if may be she just has some gluten sensitivity that probably worsens her slow gut and constipation.  But we also discussed that since she has already tried fiber supplements and MiraLAX that we could also look at prescription medication such as Linzess or Amitiza as well just encouraged her to think about it as an option.  No orders of the defined types were placed in this encounter.   Follow-up: No follow-ups on file.    Beatrice Lecher, MD

## 2021-12-22 LAB — TISSUE TRANSGLUTAMINASE ABS,IGG,IGA
(tTG) Ab, IgA: <1 U/mL
(tTG) Ab, IgG: <1 U/mL

## 2021-12-22 NOTE — Progress Notes (Signed)
Ana Lucero, great news!  Negative for gluten intolerance

## 2021-12-23 ENCOUNTER — Encounter: Payer: Self-pay | Admitting: Family Medicine

## 2021-12-23 DIAGNOSIS — K5900 Constipation, unspecified: Secondary | ICD-10-CM

## 2021-12-24 ENCOUNTER — Other Ambulatory Visit: Payer: Self-pay | Admitting: Family Medicine

## 2021-12-24 MED ORDER — LINACLOTIDE 145 MCG PO CAPS: 145.0000 ug | ORAL_CAPSULE | Freq: Every day | ORAL | 3 refills | Status: DC

## 2022-01-03 ENCOUNTER — Other Ambulatory Visit: Payer: Self-pay | Admitting: Family Medicine

## 2022-01-03 DIAGNOSIS — K5900 Constipation, unspecified: Secondary | ICD-10-CM

## 2022-01-03 MED ORDER — LUBIPROSTONE 8 MCG PO CAPS: 8.0000 ug | ORAL_CAPSULE | Freq: Two times a day (BID) | ORAL | 1 refills | Status: DC

## 2022-01-03 NOTE — Addendum Note (Signed)
Addended by: Nani Gasser D on: 01/03/2022 04:45 PM   Modules accepted: Orders

## 2022-01-03 NOTE — Telephone Encounter (Signed)
Okay, please let patient know.  I am not sure why the pharmacy did not tell her that it was not covered.  But we can try to see if Amitiza is covered.  New prescription sent to pharmacy.

## 2022-01-04 ENCOUNTER — Telehealth: Payer: Self-pay

## 2022-01-04 NOTE — Telephone Encounter (Addendum)
Initiated Prior authorization [id number] (AMITIZA) 8 MCG capsule Via: Covermymeds Case/Key:[id number] Status: approved as of 01/07/22 Reason:Effective from 01/04/2022 through 01/03/2023. Notified Pt via: Mychart

## 2022-01-04 NOTE — Telephone Encounter (Signed)
Rx requires a prior auth. Thanks in advance.

## 2022-01-06 NOTE — Telephone Encounter (Signed)
Pharmacy is requesting an update. Thanks in advance.

## 2022-03-16 ENCOUNTER — Encounter: Payer: Self-pay | Admitting: Family Medicine

## 2022-03-16 DIAGNOSIS — K5900 Constipation, unspecified: Secondary | ICD-10-CM

## 2022-03-17 MED ORDER — LINACLOTIDE 145 MCG PO CAPS: 145.0000 ug | ORAL_CAPSULE | Freq: Every day | ORAL | 2 refills | Status: DC

## 2022-05-29 ENCOUNTER — Other Ambulatory Visit: Payer: Self-pay | Admitting: Family Medicine

## 2022-05-29 DIAGNOSIS — K5900 Constipation, unspecified: Secondary | ICD-10-CM

## 2022-05-31 ENCOUNTER — Emergency Department
Admission: EM | Admit: 2022-05-31 | Discharge: 2022-05-31 | Disposition: A | Discharge status: Home / Self Care | Payer: BC Managed Care – PPO | Source: Home / Self Care | Attending: Family Medicine | Admitting: Family Medicine

## 2022-05-31 ENCOUNTER — Other Ambulatory Visit: Payer: Self-pay

## 2022-05-31 ENCOUNTER — Encounter: Payer: Self-pay | Admitting: Emergency Medicine

## 2022-05-31 DIAGNOSIS — R300 Dysuria: Secondary | ICD-10-CM | POA: Diagnosis not present

## 2022-05-31 DIAGNOSIS — N3090 Cystitis, unspecified without hematuria: Secondary | ICD-10-CM

## 2022-05-31 LAB — POCT URINALYSIS DIP (MANUAL ENTRY)
Bilirubin, UA: NEGATIVE
Glucose, UA: NEGATIVE mg/dL
Ketones, POC UA: NEGATIVE mg/dL
Nitrite, UA: NEGATIVE
Protein Ur, POC: NEGATIVE mg/dL
Spec Grav, UA: 1.01 (ref 1.010–1.025)
Urobilinogen, UA: 0.2 E.U./dL
pH, UA: 7 (ref 5.0–8.0)

## 2022-05-31 MED ORDER — NITROFURANTOIN MONOHYD MACRO 100 MG PO CAPS: 100.0000 mg | ORAL_CAPSULE | Freq: Two times a day (BID) | ORAL | 0 refills | Status: DC

## 2022-05-31 NOTE — Discharge Instructions (Signed)
Make sure that you are drinking lots of water Take the antibiotics 2 times a day for 5 full days.  Take 2 doses today Call for problems Urine has been sent for culture.  If the culture shows a need to change antibiotics, you will be notified

## 2022-05-31 NOTE — ED Provider Notes (Signed)
[facility]    CSN: [id number] Arrival date & time: 05/31/22  1211      History   Chief Complaint Chief Complaint  Patient presents with   Dysuria    HPI Ana Lucero is a 21 y.o. female.   HPI 21 year old lady here with symptoms consistent of bladder infection.  She has dysuria and frequency.  Some suprapubic pressure.  No flank pain.  No nausea vomiting.  No fever or chills.  No history of kidney stone or kidney infection.  Patient is not pregnant   History reviewed. No pertinent past medical history.  Patient Active Problem List   Diagnosis Date Noted   Constipation 12/21/2021   Bloating 12/21/2021   Gastroesophageal reflux disease with esophagitis without hemorrhage 12/21/2021   Presence of IUD 06/01/2021   Right leg pain 03/10/2021   GAD (generalized anxiety disorder) 06/10/2020   Oligomenorrhea 11/12/2019    Past Surgical History:  Procedure Laterality Date   MOUTH SURGERY     WISDOM TOOTH EXTRACTION      OB History     Gravida  0   Para  0   Term  0   Preterm  0   AB  0   Living  0      SAB  0   IAB  0   Ectopic  0   Multiple  0   Live Births  0            Home Medications    Prior to Admission medications   Medication Sig Start Date End Date Taking? Authorizing Provider  nitrofurantoin, macrocrystal-monohydrate, (MACROBID) 100 MG capsule Take 1 capsule (100 mg total) by mouth 2 (two) times daily. 05/31/22  Yes Eustace Moore, MD  Levonorgestrel (SKYLA IU) by Intrauterine route.    [provider]    Family History Family History  Problem Relation Age of Onset   Breast cancer Maternal Grandmother    Leukemia Maternal Grandfather    Breast cancer Paternal Grandmother    Ovarian cancer Paternal Grandmother     Social History Social History   Tobacco Use   Smoking status: Never   Smokeless tobacco: Never  Vaping Use   Vaping Use: Never used  Substance Use Topics   Alcohol use: No   Drug use:  Never     Allergies   Patient has no known allergies.   Review of Systems Review of Systems  See HPI Physical Exam Triage Vital Signs ED Triage Vitals  Enc Vitals Group     BP 05/31/22 1224 131/87     Pulse Rate 05/31/22 1224 91     Resp 05/31/22 1224 16     Temp 05/31/22 1224 98.7 F (37.1 C)     Temp Source 05/31/22 1224 Oral     SpO2 05/31/22 1224 100 %     Weight 05/31/22 1225 182 lb (82.6 kg)     Height 05/31/22 1225 5\' 8"  (1.727 m)     Head Circumference --      Peak Flow --      Pain Score 05/31/22 1224 2     Pain Loc --      Pain Edu? --      Excl. in GC? --    No data found.  Updated Vital Signs BP 131/87 (BP Location: Left Arm)   Pulse 91   Temp 98.7 F (37.1 C) (Oral)   Resp 16   Ht 5\' 8"  (1.727 m)  Wt 82.6 kg   SpO2 100%   BMI 27.67 kg/m      Physical Exam Constitutional:      General: She is not in acute distress.    Appearance: She is well-developed.  HENT:     Head: Normocephalic and atraumatic.  Eyes:     Conjunctiva/sclera: Conjunctivae normal.     Pupils: Pupils are equal, round, and reactive to light.  Cardiovascular:     Rate and Rhythm: Normal rate.  Pulmonary:     Effort: Pulmonary effort is normal. No respiratory distress.  Abdominal:     General: There is no distension.     Palpations: Abdomen is soft.     Tenderness: There is no right CVA tenderness or left CVA tenderness.  Musculoskeletal:        General: Normal range of motion.     Cervical back: Normal range of motion.  Skin:    General: Skin is warm and dry.  Neurological:     Mental Status: She is alert.  Psychiatric:        Mood and Affect: Mood normal.        Behavior: Behavior normal.      UC Treatments / Results  Labs (all labs ordered are listed, but only abnormal results are displayed) Labs Reviewed  POCT URINALYSIS DIP (MANUAL ENTRY) - Abnormal; Notable for the following components:      Result Value   Blood, UA large (*)    Leukocytes, UA Trace  (*)    All other components within normal limits  URINE CULTURE    EKG   Radiology No results found.  Procedures Procedures (including critical care time)  Medications Ordered in UC Medications - No data to display  Initial Impression / Assessment and Plan / UC Course  I have reviewed the triage vital signs and the nursing notes.  Pertinent labs & imaging results that were available during my care of the patient were reviewed by me and considered in my medical decision making (see chart for details).      Final Clinical Impressions(s) / UC Diagnoses   Final diagnoses:  Dysuria  Cystitis     Discharge Instructions      Make sure that you are drinking lots of water Take the antibiotics 2 times a day for 5 full days.  Take 2 doses today Call for problems Urine has been sent for culture.  If the culture shows a need to change antibiotics, you will be notified     ED Prescriptions     Medication Sig Dispense Auth. Provider   nitrofurantoin, macrocrystal-monohydrate, (MACROBID) 100 MG capsule Take 1 capsule (100 mg total) by mouth 2 (two) times daily. 10 capsule Eustace Moore, MD      PDMP not reviewed this encounter.   Eustace Moore, MD 05/31/22 1410

## 2022-05-31 NOTE — ED Triage Notes (Addendum)
Dysuria x 1 week Taking AZO Cranberry

## 2022-06-03 LAB — URINE CULTURE
MICRO NUMBER:: 13605814
SPECIMEN QUALITY:: ADEQUATE

## 2022-06-07 ENCOUNTER — Telehealth: Payer: Self-pay | Admitting: Emergency Medicine

## 2022-06-07 NOTE — Telephone Encounter (Signed)
Patient called stating that she was seen on 05/31/22 and diagnosed with a UTI.  Antibiotics given made her somewhat better but not completely.  Reviewed patient's urine culture which was sensitive to antibiotics given, patient completed medication.  Advised patient that if she is still having symptoms she needs to be re-evaluated here or with her PCP.  Patient voices understanding.

## 2022-06-10 ENCOUNTER — Other Ambulatory Visit: Payer: Self-pay | Admitting: Nurse Practitioner

## 2022-06-10 ENCOUNTER — Ambulatory Visit
Admission: RE | Admit: 2022-06-10 | Discharge: 2022-06-10 | Disposition: A | Discharge status: Home / Self Care | Payer: No Typology Code available for payment source | Source: Ambulatory Visit | Attending: Nurse Practitioner | Admitting: Nurse Practitioner

## 2022-06-10 DIAGNOSIS — Z021 Encounter for pre-employment examination: Secondary | ICD-10-CM

## 2022-06-13 ENCOUNTER — Telehealth: Payer: Self-pay | Admitting: Family Medicine

## 2022-06-13 NOTE — Telephone Encounter (Signed)
Patient dropped off Health Form for provider to complete. Patient stated she NEEDS this paperwork completed no later than 06/15/22. Patient was informed of possible fee and 3-5 day turn around. Paperwork placed in providers box. lmr

## 2022-06-13 NOTE — Telephone Encounter (Signed)
Form completed and placed in [place] B box

## 2022-06-14 NOTE — Telephone Encounter (Signed)
Forms placed up front for pt to be called and fee collected.  

## 2022-06-15 NOTE — Telephone Encounter (Signed)
Patient paid the fee over the phone, faxing the form for patient and placing patient copy in accordion for patient pick up. AMUCK

## 2023-03-02 ENCOUNTER — Ambulatory Visit: Payer: BC Managed Care – PPO | Admitting: Sports Medicine

## 2023-03-03 ENCOUNTER — Ambulatory Visit (INDEPENDENT_AMBULATORY_CARE_PROVIDER_SITE_OTHER): Payer: 59 | Admitting: Sports Medicine

## 2023-03-03 ENCOUNTER — Ambulatory Visit (INDEPENDENT_AMBULATORY_CARE_PROVIDER_SITE_OTHER): Payer: 59

## 2023-03-03 DIAGNOSIS — M7041 Prepatellar bursitis, right knee: Secondary | ICD-10-CM

## 2023-03-03 DIAGNOSIS — M25562 Pain in left knee: Secondary | ICD-10-CM | POA: Diagnosis not present

## 2023-03-03 DIAGNOSIS — M25561 Pain in right knee: Secondary | ICD-10-CM | POA: Diagnosis not present

## 2023-03-03 NOTE — Assessment & Plan Note (Signed)
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 Subsequently she had some trauma directly over the kneecap. She ended up with some swelling anteriorly, she had full motion, full strength and very little pain. This improved on its own. Her knee exam is completely normal today, good motion, good strength, all ligamentous structures intact, negative Lachman test, negative McMurray's sign. I suspect this was a prepatellar bursitis, we will print her out some information on this, I have encouraged her to wear knee protection, potentially knee sleeves when on duty. We will get some baseline x-rays today, if she has any discomfort over the next several weeks I would like to see her back and add an MRI.

## 2023-03-03 NOTE — Progress Notes (Signed)
    Procedures performed today:    None.  Independent interpretation of notes and tests performed by another provider:   None.  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 Subsequently she had some trauma directly over the kneecap. She ended up with some swelling anteriorly, she had full motion, full strength and very little pain. This improved on its own. Her knee exam is completely normal today, good motion, good strength, all ligamentous structures intact, negative Lachman test, negative McMurray's sign. I suspect this was a prepatellar bursitis, we will print her out some information on this, I have encouraged her to wear knee protection, potentially knee sleeves when on duty. We will get some baseline x-rays today, if she has any discomfort over the next several weeks I would like to see her back and add an MRI.    ____________________________________________ Ihor Austin. Benjamin Stain, M.D., ABFM., CAQSM., AME. Primary Care and Sports Medicine [facility]  Adjunct Professor of Family Medicine  [employer]  Restaurant manager, fast food

## 2023-05-20 ENCOUNTER — Ambulatory Visit
Admission: EM | Admit: 2023-05-20 | Discharge: 2023-05-20 | Disposition: A | Discharge status: Home / Self Care | Payer: 59 | Attending: Urgent Care | Admitting: Urgent Care

## 2023-05-20 ENCOUNTER — Encounter: Payer: Self-pay | Admitting: Emergency Medicine

## 2023-05-20 DIAGNOSIS — J0100 Acute maxillary sinusitis, unspecified: Secondary | ICD-10-CM | POA: Diagnosis not present

## 2023-05-20 DIAGNOSIS — J040 Acute laryngitis: Secondary | ICD-10-CM | POA: Diagnosis not present

## 2023-05-20 MED ORDER — FLUTICASONE PROPIONATE 50 MCG/ACT NA SUSP: 1.0000 | Freq: Every day | NASAL | 0 refills | Status: AC

## 2023-05-20 MED ORDER — PREDNISONE 50 MG PO TABS: 50.0000 mg | ORAL_TABLET | Freq: Every day | ORAL | 0 refills | Status: AC

## 2023-05-20 MED ORDER — AMOXICILLIN-POT CLAVULANATE 875-125 MG PO TABS: 1.0000 | ORAL_TABLET | Freq: Two times a day (BID) | ORAL | 0 refills | Status: AC

## 2023-05-20 NOTE — ED Provider Notes (Signed)
[facility]    CSN: [id number] Arrival date & time: 05/20/23  1432      History   Chief Complaint Chief Complaint  Patient presents with   Sore Throat    HPI Ana Lucero is a 22 y.o. female.   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  She states she started with cold like symptoms about a week ago, with congestion and nasal pressure, which over the past several days has turned into a sore throat.  Over the past 2 days, she is now lost her voice.  She had a fever at the initiation of symptoms.  She reports decreased taste and smell.  History of sinus infections, currently having discomfort and pressure over her right maxillary sinus.  Patient endorses postnasal drainage.  She does have a cough but it is dry with no mucus production.  She has been taking over-the-counter cough cold preparations without symptom relief.   Sore Throat    History reviewed. No pertinent past medical history.  Patient Active Problem List   Diagnosis Date Noted   Prepatellar bursitis, right knee 03/03/2023   Constipation 12/21/2021   Bloating 12/21/2021   Gastroesophageal reflux disease with esophagitis without hemorrhage 12/21/2021   Presence of IUD 06/01/2021   Right leg pain 03/10/2021   GAD (generalized anxiety disorder) 06/10/2020   Oligomenorrhea 11/12/2019    Past Surgical History:  Procedure Laterality Date   MOUTH SURGERY     WISDOM TOOTH EXTRACTION      OB History     Gravida  0   Para  0   Term  0   Preterm  0   AB  0   Living  0      SAB  0   IAB  0   Ectopic  0   Multiple  0   Live Births  0            Home Medications    Prior to Admission medications   Medication Sig Start Date End Date Taking? Authorizing Provider  amoxicillin-clavulanate (AUGMENTIN) 875-125 MG tablet Take 1 tablet by mouth 2 (two) times daily with a meal for 10 days. 05/20/23 05/30/23 Yes Onyekachi Gathright L, PA   fluticasone (FLONASE) 50 MCG/ACT nasal spray Place 1 spray into both nostrils daily. 05/20/23  Yes Thersea Manfredonia L, PA  predniSONE (DELTASONE) 50 MG tablet Take 1 tablet (50 mg total) by mouth daily with breakfast. 05/20/23  Yes Jayshawn Colston L, PA  Levonorgestrel (SKYLA IU) by Intrauterine route.    [provider]    Family History Family History  Problem Relation Age of Onset   Healthy Mother    Healthy Father    Breast cancer Maternal Grandmother    Leukemia Maternal Grandfather    Breast cancer Paternal Grandmother    Ovarian cancer Paternal Grandmother     Social History Social History   Tobacco Use   Smoking status: Never   Smokeless tobacco: Never  Vaping Use   Vaping Use: Never used  Substance Use Topics   Alcohol use: No   Drug use: Never     Allergies   Patient has no known allergies.   Review of Systems Review of Systems As per HPI  Physical Exam Triage Vital Signs ED Triage Vitals  Enc Vitals Group     BP 05/20/23 1456 118/82     Pulse Rate 05/20/23 1456 69     Resp 05/20/23 1456  14     Temp 05/20/23 1456 97.8 F (36.6 C)     Temp Source 05/20/23 1456 Oral     SpO2 05/20/23 1456 100 %     Weight 05/20/23 1458 157 lb (71.2 kg)     Height 05/20/23 1458 5\' 8"  (1.727 m)     Head Circumference --      Peak Flow --      Pain Score 05/20/23 1458 3     Pain Loc --      Pain Edu? --      Excl. in GC? --    No data found.  Updated Vital Signs BP 118/82 (BP Location: Left Arm)   Pulse 69   Temp 97.8 F (36.6 C) (Oral)   Resp 14   Ht 5\' 8"  (1.727 m)   Wt 157 lb (71.2 kg)   SpO2 100%   BMI 23.87 kg/m   Visual Acuity Right Eye Distance:   Left Eye Distance:   Bilateral Distance:    Right Eye Near:   Left Eye Near:    Bilateral Near:     Physical Exam Vitals and nursing note reviewed.  Constitutional:      General: She is not in acute distress.    Appearance: Normal appearance. She is well-developed and normal weight. She  is not ill-appearing, toxic-appearing or diaphoretic.     Comments: Hoarse, raspy voice  HENT:     Head: Normocephalic and atraumatic.     Right Ear: Ear canal and external ear normal. No drainage, swelling or tenderness. A middle ear effusion is present. There is no impacted cerumen. Tympanic membrane is not erythematous.     Left Ear: Ear canal and external ear normal. No drainage, swelling or tenderness. A middle ear effusion is present. There is no impacted cerumen. Tympanic membrane is not erythematous.     Nose: Congestion and rhinorrhea present.     Right Turbinates: Enlarged and swollen.     Left Turbinates: Enlarged and swollen.     Right Sinus: Maxillary sinus tenderness present. No frontal sinus tenderness.     Left Sinus: No maxillary sinus tenderness or frontal sinus tenderness.     Mouth/Throat:     Mouth: Mucous membranes are moist. No oral lesions.     Pharynx: Oropharynx is clear. Uvula midline. No pharyngeal swelling, oropharyngeal exudate, posterior oropharyngeal erythema or uvula swelling.     Tonsils: No tonsillar exudate or tonsillar abscesses.  Eyes:     General: No scleral icterus.       Right eye: No discharge.        Left eye: No discharge.     Extraocular Movements: Extraocular movements intact.     Conjunctiva/sclera: Conjunctivae normal.     Pupils: Pupils are equal, round, and reactive to light.  Cardiovascular:     Rate and Rhythm: Normal rate and regular rhythm.     Pulses: Normal pulses.     Heart sounds: No murmur heard. Pulmonary:     Effort: Pulmonary effort is normal. No respiratory distress.     Breath sounds: Normal breath sounds. No stridor. No wheezing, rhonchi or rales.  Chest:     Chest wall: No tenderness.  Musculoskeletal:     Cervical back: Normal range of motion and neck supple. No rigidity or tenderness.  Lymphadenopathy:     Cervical: No cervical adenopathy.  Skin:    General: Skin is warm and dry.     Findings: No erythema or  rash.  Neurological:     General: No focal deficit present.     Mental Status: She is alert and oriented to person, place, and time.      UC Treatments / Results  Labs (all labs ordered are listed, but only abnormal results are displayed) Labs Reviewed - No data to display  EKG   Radiology No results found.  Procedures Procedures (including critical care time)  Medications Ordered in UC Medications - No data to display  Initial Impression / Assessment and Plan / UC Course  I have reviewed the triage vital signs and the nursing notes.  Pertinent labs & imaging results that were available during my care of the patient were reviewed by me and considered in my medical decision making (see chart for details).     Acute maxillary sinusitis - will start Augmentin BID x 10 days in addition to flonase to help with middle ear effusions.  Laryngitis - PO prednisone. Likely secondary in part to post-nasal drainage.   Final Clinical Impressions(s) / UC Diagnoses   Final diagnoses:  Acute non-recurrent maxillary sinusitis  Laryngitis, acute     Discharge Instructions      You have a sinus infection and laryngitis.  Please start taking the antibiotic, Augmentin, twice daily with food. Take it for all 10 days, do not stop early just because you feel better. Take an over the counter probiotic or yogurt daily to help prevent diarrhea/ yeast infection.  Use Flonase daily to help with inflammation of the nasal passage. It is also recommended that you use nasal saline/ sinus washes to cleans the sinus passages.  Take prednisone once daily in the morning with breakfast. If you take prior to bed it can cause insomnia.  Hot steam from a shower or vaporizer may also be beneficial to help open up the upper airway. Eucalyptus can be helpful.  If any worsening symptoms such as headache, fever, or shortness of breath, please return for recheck.     ED Prescriptions     Medication  Sig Dispense Auth. Provider   amoxicillin-clavulanate (AUGMENTIN) 875-125 MG tablet Take 1 tablet by mouth 2 (two) times daily with a meal for 10 days. 20 tablet Lillyauna Jenkinson L, PA   fluticasone (FLONASE) 50 MCG/ACT nasal spray Place 1 spray into both nostrils daily. 16 mL Justyna Timoney L, PA   predniSONE (DELTASONE) 50 MG tablet Take 1 tablet (50 mg total) by mouth daily with breakfast. 5 tablet Shanisha Lech L, PA      PDMP not reviewed this encounter.   Maretta Bees, [place] 05/20/23 2228

## 2023-05-20 NOTE — ED Triage Notes (Signed)
Sore throat 1 week ago w/ fever ( pt felt hot w/ chills - thermometer at home is broken Pt presents now with hoarseness & sinus pressure & drainage  Pt has frequent sinus infections - amoxicillin has not worked in the past

## 2023-05-20 NOTE — Discharge Instructions (Signed)
You have a sinus infection and laryngitis.  Please start taking the antibiotic, Augmentin, twice daily with food. Take it for all 10 days, do not stop early just because you feel better. Take an over the counter probiotic or yogurt daily to help prevent diarrhea/ yeast infection.  Use Flonase daily to help with inflammation of the nasal passage. It is also recommended that you use nasal saline/ sinus washes to cleans the sinus passages.  Take prednisone once daily in the morning with breakfast. If you take prior to bed it can cause insomnia.  Hot steam from a shower or vaporizer may also be beneficial to help open up the upper airway. Eucalyptus can be helpful.  If any worsening symptoms such as headache, fever, or shortness of breath, please return for recheck.

## 2023-12-06 ENCOUNTER — Emergency Department (HOSPITAL_COMMUNITY)
Admission: EM | Admit: 2023-12-06 | Discharge: 2023-12-06 | Disposition: A | Discharge status: Home / Self Care | Payer: 59 | Attending: Emergency Medicine | Admitting: Emergency Medicine

## 2023-12-06 ENCOUNTER — Other Ambulatory Visit: Payer: Self-pay

## 2023-12-06 DIAGNOSIS — W540XXA Bitten by dog, initial encounter: Secondary | ICD-10-CM | POA: Insufficient documentation

## 2023-12-06 DIAGNOSIS — S71152A Open bite, left thigh, initial encounter: Secondary | ICD-10-CM | POA: Insufficient documentation

## 2023-12-06 MED ORDER — AMOXICILLIN-POT CLAVULANATE 875-125 MG PO TABS
1.0000 | ORAL_TABLET | Freq: Once | ORAL | Status: AC
  Administered 2023-12-06: 1 via ORAL
  Filled 2023-12-06: qty 1

## 2023-12-06 MED ORDER — AMOXICILLIN-POT CLAVULANATE 875-125 MG PO TABS: 1.0000 | ORAL_TABLET | Freq: Two times a day (BID) | ORAL | 0 refills | Status: AC

## 2023-12-06 NOTE — ED Triage Notes (Signed)
 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

## 2023-12-06 NOTE — ED Provider Notes (Signed)
 [place] [facility] Provider Note   CSN: [id number] Arrival date & time: 12/06/23  2259     History  Chief Complaint  Patient presents with   Dog Bite L upper Thigh    [employer] officer    Ana Lucero is a 23 y.o. female.  The history is provided by the patient and medical records.   23 year old female presenting to the ED after a dog bite.  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  Owner reports dog is up-to-date on vaccines.  Her tetanus is up-to-date.  Home Medications Prior to Admission medications   Medication Sig Start Date End Date Taking? Authorizing Provider  amoxicillin -clavulanate (AUGMENTIN ) 875-125 MG tablet Take 1 tablet by mouth every 12 (twelve) hours. 12/06/23  Yes Jarold Olam HERO, PA-C  fluticasone  (FLONASE ) 50 MCG/ACT nasal spray Place 1 spray into both nostrils daily. 05/20/23   Crain, Whitney L, PA  Levonorgestrel  (SKYLA  IU) by Intrauterine route.    [provider]  predniSONE  (DELTASONE ) 50 MG tablet Take 1 tablet (50 mg total) by mouth daily with breakfast. 05/20/23   Lowella Folks L, PA      Allergies    Patient has no known allergies.    Review of Systems   Review of Systems  Skin:  Positive for wound.  All other systems reviewed and are negative.   Physical Exam Updated Vital Signs BP (!) 135/95 (BP Location: Right Arm)   Pulse (!) 112   Temp 98 F (36.7 C)   Resp 18   SpO2 99%   Physical Exam Vitals and nursing note reviewed.  Constitutional:      Appearance: She is well-developed.  HENT:     Head: Normocephalic and atraumatic.  Eyes:     Conjunctiva/sclera: Conjunctivae normal.     Pupils: Pupils are equal, round, and reactive to light.  Cardiovascular:     Rate and Rhythm: Normal rate and regular rhythm.     Heart sounds: Normal heart sounds.  Pulmonary:     Effort: Pulmonary effort is normal.     Breath sounds: Normal breath sounds.  Abdominal:      General: Bowel sounds are normal.     Palpations: Abdomen is soft.  Musculoskeletal:        General: Normal range of motion.     Cervical back: Normal range of motion.     Comments: Superficial puncture wound to left anterior thigh, there is some localized bruising, mildly tender as expected, no active bleeding  Skin:    General: Skin is warm and dry.  Neurological:     Mental Status: She is alert and oriented to person, place, and time.     ED Results / Procedures / Treatments   Labs (all labs ordered are listed, but only abnormal results are displayed) Labs Reviewed - No data to display  EKG None  Radiology No results found.  Procedures Procedures    Medications Ordered in ED Medications  amoxicillin -clavulanate (AUGMENTIN ) 875-125 MG per tablet 1 tablet (has no administration in time range)    ED Course/ Medical Decision Making/ A&P                                 Medical Decision Making Risk Prescription drug management.   23 year old female here with dog bite to left anterior thigh.  Dog  was only, owner reports up-to-date on vaccines.  Superficial puncture wound to left anterior thigh with some localized bruising.  There is no laceration or active bleeding.  Tetanus is up-to-date.  Will start on Augmentin , first dose given here.  Monitor for signs of infection.  Can follow-up with PCP.  Return here for any concerns.  Final Clinical Impression(s) / ED Diagnoses Final diagnoses:  Dog bite, initial encounter    Rx / DC Orders ED Discharge Orders          Ordered    amoxicillin -clavulanate (AUGMENTIN ) 875-125 MG tablet  Every 12 hours        12/06/23 2315              Jarold Olam HERO, PA-C 12/06/23 2319    Theadore Ozell HERO, MD 12/07/23 [phone]

## 2024-06-10 ENCOUNTER — Encounter: Payer: Self-pay | Admitting: Obstetrics & Gynecology

## 2024-06-10 ENCOUNTER — Other Ambulatory Visit (HOSPITAL_COMMUNITY)
Admission: RE | Admit: 2024-06-10 | Discharge: 2024-06-10 | Disposition: A | Discharge status: Home / Self Care | Source: Ambulatory Visit | Attending: Obstetrics & Gynecology | Admitting: Obstetrics & Gynecology

## 2024-06-10 ENCOUNTER — Ambulatory Visit (INDEPENDENT_AMBULATORY_CARE_PROVIDER_SITE_OTHER): Admitting: Obstetrics & Gynecology

## 2024-06-10 VITALS — BP 102/67 | HR 77 | Ht 68.0 in | Wt 169.1 lb

## 2024-06-10 DIAGNOSIS — Z01419 Encounter for gynecological examination (general) (routine) without abnormal findings: Secondary | ICD-10-CM | POA: Insufficient documentation

## 2024-06-10 DIAGNOSIS — Z113 Encounter for screening for infections with a predominantly sexual mode of transmission: Secondary | ICD-10-CM | POA: Diagnosis not present

## 2024-06-10 DIAGNOSIS — N939 Abnormal uterine and vaginal bleeding, unspecified: Secondary | ICD-10-CM | POA: Diagnosis not present

## 2024-06-10 DIAGNOSIS — Z1331 Encounter for screening for depression: Secondary | ICD-10-CM

## 2024-06-10 NOTE — Progress Notes (Signed)
   Subjective:     Ana Lucero is a 23 y.o. female here for a routine exam.  Current complaints: has menstrual cycle with Kyleena --will have dark spotting after the period but not every period  Has about 9 cycles a year.    Gynecologic History Patient's last menstrual period was 05/06/2024 (approximate). Contraception: [facility] 05/2021 .Last pap smear (date and result):  never Last mammogram (date and result):2024- Pt reported  Last colon screening (date and result):NA Brush:yes Floss:yes Seatbelts: yes Sunscreen: yes  Obstetric History OB History  Gravida Para Term Preterm AB Living  0 0 0 0 0 0  SAB IAB Ectopic Multiple Live Births  0 0 0 0 0     The following portions of the patient's history were reviewed and updated as appropriate: allergies, current medications, past family history, past medical history, past social history, past surgical history, and problem list.  Review of Systems Pertinent items noted in HPI and remainder of comprehensive ROS otherwise negative.    Objective:     Vitals:   06/10/24 1414  BP: 102/67  Pulse: 77  Weight: 169 lb 1.3 oz (76.7 kg)  Height: 5' 8 (1.727 m)   Vitals:  WNL General appearance: alert, cooperative and no distress  HEENT: Normocephalic, without obvious abnormality, atraumatic Eyes: negative Throat: lips, mucosa, and tongue normal; teeth and gums normal  Respiratory: Clear to auscultation bilaterally  CV: Regular rate and rhythm  Breasts:  Normal appearance, no masses or tenderness, no nipple retraction or dimpling  GI: Soft, non-tender; bowel sounds normal; no masses,  no organomegaly  GU: External Genitalia:  Tanner V, no lesion Urethra:  No prolapse   Vagina: Pink, normal rugae, no blood or discharge  Cervix: No CMT, no lesion  Uterus:  Normal size and contour, non tender  Adnexa: Normal, no masses, non tender  Musculoskeletal: No edema, redness or tenderness in the calves or thighs  Skin: No lesions or rash   Lymphatic: Axillary adenopathy: none     Psychiatric: Normal mood and behavior        Assessment:    Healthy female exam.  Bleeding with IUD   Plan:   Pap with reflex HPV STD screening--blood and cervical Bleeding on IUD--pelvic US  complete with TVUS Paternal GM with breast and ovarian cancer--offered genetic tetsing to Antanisha and also advised that her father should be tested as closest relative to the relative with cancer.

## 2024-06-11 LAB — RPR+HBSAG+HCVAB+...
HIV Screen 4th Generation wRfx: NONREACTIVE
Hep C Virus Ab: NONREACTIVE
Hepatitis B Surface Ag: NEGATIVE
RPR Ser Ql: NONREACTIVE

## 2024-06-12 ENCOUNTER — Ambulatory Visit: Payer: Self-pay | Admitting: Obstetrics & Gynecology

## 2024-06-17 ENCOUNTER — Encounter: Payer: Self-pay | Admitting: Obstetrics & Gynecology

## 2024-06-17 DIAGNOSIS — R87612 Low grade squamous intraepithelial lesion on cytologic smear of cervix (LGSIL): Secondary | ICD-10-CM | POA: Insufficient documentation

## 2024-06-17 LAB — CYTOLOGY - PAP
Chlamydia: NEGATIVE
Comment: NEGATIVE
Comment: NORMAL
Neisseria Gonorrhea: NEGATIVE

## 2024-07-30 ENCOUNTER — Encounter: Payer: Self-pay | Admitting: Sports Medicine
